# Patient Record
Sex: Female | Born: 1985 | Race: White | Hispanic: No | Marital: Married | Smoking: Never smoker
Health system: Southern US, Community
[De-identification: ages and names within clinical notes are randomized; demographics above are authoritative.]

## PROBLEM LIST (undated history)

## (undated) HISTORY — PX: WISDOM TOOTH EXTRACTION: SHX21

---

## 2017-07-26 ENCOUNTER — Ambulatory Visit: Payer: Self-pay | Admitting: Podiatry

## 2019-06-10 ENCOUNTER — Ambulatory Visit
Admission: EM | Admit: 2019-06-10 | Discharge: 2019-06-10 | Disposition: A | Discharge status: Home / Self Care | Payer: 59 | Attending: Family Medicine | Admitting: Family Medicine

## 2019-06-10 ENCOUNTER — Other Ambulatory Visit: Payer: Self-pay

## 2019-06-10 DIAGNOSIS — J309 Allergic rhinitis, unspecified: Secondary | ICD-10-CM | POA: Diagnosis not present

## 2019-06-10 DIAGNOSIS — M546 Pain in thoracic spine: Secondary | ICD-10-CM | POA: Diagnosis not present

## 2019-06-10 DIAGNOSIS — U071 COVID-19: Secondary | ICD-10-CM

## 2019-06-10 HISTORY — DX: COVID-19: U07.1

## 2019-06-10 MED ORDER — TIZANIDINE HCL 4 MG PO TABS: 4.0000 mg | ORAL_TABLET | Freq: Three times a day (TID) | ORAL | 0 refills | Status: DC | PRN

## 2019-06-10 MED ORDER — IPRATROPIUM BROMIDE 0.06 % NA SOLN: 2.0000 | Freq: Four times a day (QID) | NASAL | 0 refills | Status: DC | PRN

## 2019-06-10 MED ORDER — MELOXICAM 15 MG PO TABS: 15.0000 mg | ORAL_TABLET | Freq: Every day | ORAL | 0 refills | Status: DC | PRN

## 2019-06-10 NOTE — Discharge Instructions (Signed)
Medications as prescribed.  Heat/hot bath/hot shower. Be careful with use of muscle relaxer (zanaflex) as it may make you drowsy.  Take care  Dr. Adriana Simas

## 2019-06-10 NOTE — ED Provider Notes (Signed)
[facility]    CSN: [id number] Arrival date & time: 06/10/19  2831  History   Chief Complaint Chief Complaint  Patient presents with  . Back Pain  . Nasal Congestion   HPI  34 year old female presents with the above complaints.  Patient reports that she developed mid to upper back pain on Sunday.  No fall, trauma, injury.  Bilateral.  She states that it has continued to persist and worsen.  Radiates upward between the shoulder blades.  Rates the pain as 9/10 in severity.  She is taken some over-the-counter medication without improvement.  No known relieving factors.  Additionally, patient reports that she has had nasal congestion since yesterday.  Patient states that she does not feel well.  She states that she has an associated headache which has not responded to over-the-counter medication.  No documented fever.  No sick contacts.  No other complaints.  Past Surgical History:  Procedure Laterality Date  . CESAREAN SECTION    . WISDOM TOOTH EXTRACTION     OB History   No obstetric history on file.    Home Medications    Prior to Admission medications   Medication Sig Start Date End Date Taking? Authorizing Provider  levothyroxine (SYNTHROID) 112 MCG tablet TAKE 1 TAB(S) ONCE A DAY ORALLY 30 DAYS -- EVERY EVENING 02/15/16  Yes [provider]  ipratropium (ATROVENT) 0.06 % nasal spray Place 2 sprays into both nostrils 4 (four) times daily as needed for rhinitis. 06/10/19   Coral Spikes, DO  meloxicam (MOBIC) 15 MG tablet Take 1 tablet (15 mg total) by mouth daily as needed for pain. 06/10/19   Coral Spikes, DO  tiZANidine (ZANAFLEX) 4 MG tablet Take 1 tablet (4 mg total) by mouth every 8 (eight) hours as needed for muscle spasms. 06/10/19   Coral Spikes, DO    Family History Family History  Problem Relation Age of Onset  . Healthy Mother   . Healthy Father     Social History Social History   Tobacco Use  . Smoking status: Never Smoker  .  Smokeless tobacco: Never Used  Substance Use Topics  . Alcohol use: Never  . Drug use: Never     Allergies   Patient has no known allergies.   Review of Systems Review of Systems  Constitutional: Negative for fever.  HENT: Positive for congestion.   Musculoskeletal: Positive for back pain.   Physical Exam Triage Vital Signs ED Triage Vitals  Enc Vitals Group     BP 06/10/19 0910 119/76     Pulse Rate 06/10/19 0910 91     Resp 06/10/19 0910 18     Temp 06/10/19 0910 98.3 F (36.8 C)     Temp Source 06/10/19 0910 Oral     SpO2 06/10/19 0910 100 %     Weight 06/10/19 0907 240 lb (108.9 kg)     Height 06/10/19 0907 5\' 8"  (1.727 m)     Head Circumference --      Peak Flow --      Pain Score 06/10/19 0907 9     Pain Loc --      Pain Edu? --      Excl. in [place]? --    Updated Vital Signs BP 119/76 (BP Location: Right Arm)   Pulse 91   Temp 98.3 F (36.8 C) (Oral)   Resp 18   Ht 5\' 8"  (1.727 m)   Wt 108.9 kg   LMP 06/08/2019  SpO2 100%   BMI 36.49 kg/m   Visual Acuity Right Eye Distance:   Left Eye Distance:   Bilateral Distance:    Right Eye Near:   Left Eye Near:    Bilateral Near:     Physical Exam Vitals and nursing note reviewed.  Constitutional:      General: She is not in acute distress.    Appearance: Normal appearance. She is not ill-appearing.  HENT:     Head: Normocephalic and atraumatic.     Ears:     Comments: TMs with scarring.  No erythema, bulging, or effusion.    Nose: Congestion present. No rhinorrhea.  Eyes:     General:        Right eye: No discharge.        Left eye: No discharge.     Conjunctiva/sclera: Conjunctivae normal.  Cardiovascular:     Rate and Rhythm: Normal rate and regular rhythm.     Heart sounds: No murmur.  Pulmonary:     Effort: Pulmonary effort is normal.     Breath sounds: Normal breath sounds. No wheezing, rhonchi or rales.  Musculoskeletal:     Comments: Thoracic spine -no tenderness to palpation.   Neurological:     Mental Status: She is alert.  Psychiatric:        Mood and Affect: Mood normal.        Behavior: Behavior normal.    UC Treatments / Results  Labs (all labs ordered are listed, but only abnormal results are displayed) Labs Reviewed - No data to display  EKG   Radiology No results found.  Procedures Procedures (including critical care time)  Medications Ordered in UC Medications - No data to display  Initial Impression / Assessment and Plan / UC Course  I have reviewed the triage vital signs and the nursing notes.  Pertinent labs & imaging results that were available during my care of the patient were reviewed by me and considered in my medical decision making (see chart for details).    34 year old female presents with back pain and allergic rhinitis.  Treating with Atrovent nasal spray as well as Zanaflex and Mobic.  Supportive care.  Final Clinical Impressions(s) / UC Diagnoses   Final diagnoses:  Acute bilateral thoracic back pain  Allergic rhinitis, unspecified seasonality, unspecified trigger     Discharge Instructions     Medications as prescribed.  Heat/hot bath/hot shower. Be careful with use of muscle relaxer (zanaflex) as it may make you drowsy.  Take care  Dr. Adriana Simas    ED Prescriptions    Medication Sig Dispense Auth. Provider   ipratropium (ATROVENT) 0.06 % nasal spray Place 2 sprays into both nostrils 4 (four) times daily as needed for rhinitis. 15 mL Layaan Mott G, DO   tiZANidine (ZANAFLEX) 4 MG tablet Take 1 tablet (4 mg total) by mouth every 8 (eight) hours as needed for muscle spasms. 30 tablet Hazelle Woollard G, DO   meloxicam (MOBIC) 15 MG tablet Take 1 tablet (15 mg total) by mouth daily as needed for pain. 30 tablet Tommie Sams, DO     PDMP not reviewed this encounter.   Everlene Other Lehr, [place] 06/10/19 [phone]

## 2019-06-10 NOTE — ED Triage Notes (Signed)
Patient states that she has been having mid to upper back pain since Sunday. States that the back pain has continued to worsen.   States that she has nasal congestion that she noticed yesterday around lunchtime.

## 2019-06-16 ENCOUNTER — Ambulatory Visit (INDEPENDENT_AMBULATORY_CARE_PROVIDER_SITE_OTHER)
Admission: EM | Admit: 2019-06-16 | Discharge: 2019-06-16 | Disposition: A | Discharge status: Other Acute Inpatient Hospital | Payer: 59 | Source: Home / Self Care | Attending: Family Medicine | Admitting: Family Medicine

## 2019-06-16 ENCOUNTER — Other Ambulatory Visit: Payer: Self-pay

## 2019-06-16 ENCOUNTER — Emergency Department
Admission: EM | Admit: 2019-06-16 | Discharge: 2019-06-16 | Disposition: A | Discharge status: Home / Self Care | Payer: 59 | Attending: Family Medicine | Admitting: Family Medicine

## 2019-06-16 ENCOUNTER — Ambulatory Visit (INDEPENDENT_AMBULATORY_CARE_PROVIDER_SITE_OTHER): Payer: 59

## 2019-06-16 ENCOUNTER — Encounter: Payer: Self-pay | Admitting: Emergency Medicine

## 2019-06-16 DIAGNOSIS — S93119A Dislocation of interphalangeal joint of unspecified toe(s), initial encounter: Secondary | ICD-10-CM | POA: Diagnosis not present

## 2019-06-16 DIAGNOSIS — S99922A Unspecified injury of left foot, initial encounter: Secondary | ICD-10-CM | POA: Diagnosis present

## 2019-06-16 DIAGNOSIS — Y999 Unspecified external cause status: Secondary | ICD-10-CM | POA: Diagnosis not present

## 2019-06-16 DIAGNOSIS — W109XXA Fall (on) (from) unspecified stairs and steps, initial encounter: Secondary | ICD-10-CM | POA: Diagnosis not present

## 2019-06-16 DIAGNOSIS — Y9301 Activity, walking, marching and hiking: Secondary | ICD-10-CM | POA: Diagnosis not present

## 2019-06-16 DIAGNOSIS — Y929 Unspecified place or not applicable: Secondary | ICD-10-CM | POA: Insufficient documentation

## 2019-06-16 DIAGNOSIS — W108XXA Fall (on) (from) other stairs and steps, initial encounter: Secondary | ICD-10-CM | POA: Insufficient documentation

## 2019-06-16 DIAGNOSIS — S93115A Dislocation of interphalangeal joint of left lesser toe(s), initial encounter: Secondary | ICD-10-CM | POA: Insufficient documentation

## 2019-06-16 DIAGNOSIS — S92912A Unspecified fracture of left toe(s), initial encounter for closed fracture: Secondary | ICD-10-CM

## 2019-06-16 DIAGNOSIS — S93105A Unspecified dislocation of left toe(s), initial encounter: Secondary | ICD-10-CM

## 2019-06-16 DIAGNOSIS — S92535A Nondisplaced fracture of distal phalanx of left lesser toe(s), initial encounter for closed fracture: Secondary | ICD-10-CM | POA: Diagnosis not present

## 2019-06-16 DIAGNOSIS — Z8616 Personal history of COVID-19: Secondary | ICD-10-CM | POA: Insufficient documentation

## 2019-06-16 MED ORDER — MELOXICAM 15 MG PO TABS: 15.0000 mg | ORAL_TABLET | Freq: Every day | ORAL | 0 refills | Status: DC

## 2019-06-16 MED ORDER — TRAMADOL HCL 50 MG PO TABS: 50.0000 mg | ORAL_TABLET | Freq: Four times a day (QID) | ORAL | 0 refills | Status: DC | PRN

## 2019-06-16 MED ORDER — MELOXICAM 7.5 MG PO TABS
15.0000 mg | ORAL_TABLET | Freq: Once | ORAL | Status: AC
  Administered 2019-06-16: 15 mg via ORAL
  Filled 2019-06-16: qty 2

## 2019-06-16 NOTE — ED Provider Notes (Signed)
[facility]    CSN: [id number] Arrival date & time: 06/16/19  1659  History   Chief Complaint Chief Complaint  Patient presents with  . Toe Injury   HPI  34 year old female presents with a toe injury.  Patient reports that she fell within the stairs today.  In doing so she injured her left second toe.  The toe is visibly deformed.  Pain is 10/10 in severity.  No relieving factors.  No other associated symptoms.  No other complaints.  Past Medical History:  Diagnosis Date  . COVID-19 06/10/2019   Past Surgical History:  Procedure Laterality Date  . CESAREAN SECTION    . WISDOM TOOTH EXTRACTION     OB History   No obstetric history on file.    Home Medications    Prior to Admission medications   Medication Sig Start Date End Date Taking? Authorizing Provider  levothyroxine (SYNTHROID) 112 MCG tablet TAKE 1 TAB(S) ONCE A DAY ORALLY 30 DAYS -- EVERY EVENING 02/15/16  Yes [provider]  meloxicam (MOBIC) 15 MG tablet Take 1 tablet (15 mg total) by mouth daily. 06/16/19   Cuthriell, Charline Bills, PA-C  traMADol (ULTRAM) 50 MG tablet Take 1 tablet (50 mg total) by mouth every 6 (six) hours as needed. 06/16/19   Cuthriell, Charline Bills, PA-C  ipratropium (ATROVENT) 0.06 % nasal spray Place 2 sprays into both nostrils 4 (four) times daily as needed for rhinitis. 06/10/19 06/16/19  Coral Spikes, DO    Family History Family History  Problem Relation Age of Onset  . Healthy Mother   . Healthy Father     Social History Social History   Tobacco Use  . Smoking status: Never Smoker  . Smokeless tobacco: Never Used  Substance Use Topics  . Alcohol use: Never  . Drug use: Never     Allergies   Patient has no known allergies.   Review of Systems Review of Systems  Musculoskeletal:       Left second toe pain, swelling.   Physical Exam Triage Vital Signs ED Triage Vitals  Enc Vitals Group     BP 06/16/19 1724 119/82     Pulse Rate 06/16/19 1724 (!)  110     Resp 06/16/19 1724 18     Temp 06/16/19 1724 98.8 F (37.1 C)     Temp Source 06/16/19 1724 Oral     SpO2 06/16/19 1724 98 %     Weight 06/16/19 1722 240 lb (108.9 kg)     Height 06/16/19 1722 5\' 9"  (1.753 m)     Head Circumference --      Peak Flow --      Pain Score 06/16/19 1721 10     Pain Loc --      Pain Edu? --      Excl. in [place]? --    No data found.  Updated Vital Signs BP 119/82 (BP Location: Right Arm)   Pulse (!) 110   Temp 98.8 F (37.1 C) (Oral)   Resp 18   Ht 5\' 9"  (1.753 m)   Wt 108.9 kg   LMP 06/08/2019   SpO2 98%   BMI 35.44 kg/m   Visual Acuity Right Eye Distance:   Left Eye Distance:   Bilateral Distance:    Right Eye Near:   Left Eye Near:    Bilateral Near:     Physical Exam Vitals and nursing note reviewed.  Constitutional:      General:  She is not in acute distress.    Appearance: Normal appearance. She is obese. She is not ill-appearing.  HENT:     Head: Normocephalic and atraumatic.  Eyes:     General:        Right eye: No discharge.        Left eye: No discharge.     Conjunctiva/sclera: Conjunctivae normal.  Pulmonary:     Effort: Pulmonary effort is normal. No respiratory distress.  Musculoskeletal:     Comments: Left second toe - swelling, tenderness to palpation. Visible deformity.  Neurological:     Mental Status: She is alert.  Psychiatric:        Mood and Affect: Mood normal.        Behavior: Behavior normal.    UC Treatments / Results  Labs (all labs ordered are listed, but only abnormal results are displayed) Labs Reviewed - No data to display  EKG   Radiology DG Toe 2nd Left  Result Date: 06/16/2019 CLINICAL DATA:  Larey Seat down stairs with second toe injury. EXAM: LEFT SECOND TOE COMPARISON:  None. FINDINGS: Three views study shows PIP joint dislocation at the second toe with dorsal distraction of the middle phalanx relative to the head of the proximal phalanx. Cortical bone fragments are seen along the  plantar aspect of the head of the proximal phalanx with corresponding donor site seen plantar aspect of the base of the middle phalanx. IMPRESSION: Fracture dislocation involving the PIP joint of the second toe. Electronically Signed   By: Kennith Center M.D.   On: 06/16/2019 18:32    Procedures Procedures (including critical care time)  Medications Ordered in UC Medications - No data to display  Initial Impression / Assessment and Plan / UC Course  I have reviewed the triage vital signs and the nursing notes.  Pertinent labs & imaging results that were available during my care of the patient were reviewed by me and considered in my medical decision making (see chart for details).    34 year old female presents with a fracture dislocation of the PIP joint of the left second toe.  Patient going to the ER for reduction.  Final Clinical Impressions(s) / UC Diagnoses   Final diagnoses:  Dislocation of interphalangeal joint of toe, initial encounter  Closed nondisplaced fracture of phalanx of toe of left foot, unspecified toe, initial encounter     Discharge Instructions     Go to the hospital.  They will be able to see the Xray.  Take care  Dr. Adriana Simas    ED Prescriptions    None     PDMP not reviewed this encounter.   Tommie Sams, [place] 06/16/19 2312

## 2019-06-16 NOTE — Discharge Instructions (Signed)
Go to the hospital.  They will be able to see the Xray.  Take care  Dr. Adriana Simas

## 2019-06-16 NOTE — ED Triage Notes (Signed)
Patient ambulatory to triage with steady gait, without difficulty or distress noted, mask in place; pt reports fell down stairs today and was sent over by [facility] for dislocated left 2nd toe; pt denies any other c/o or injuries

## 2019-06-16 NOTE — ED Triage Notes (Signed)
Patient states she fell down the stairs today injuring her left 2nd toe.

## 2019-06-16 NOTE — ED Provider Notes (Signed)
[facility] Emergency Department Provider Note  ____________________________________________  Time seen: Approximately 10:11 PM  I have reviewed the triage vital signs and the nursing notes.   HISTORY  Chief Complaint Toe Injury    HPI Sherry Powers is a 34 y.o. female who presents the emergency department complaining of toe dislocation fracture.  Patient states that she accidentally fell down a flight of stairs earlier today.  She was seen in urgent care, it was found that she had a dislocated toe fracture.  Patient had avulsion fracture off of the the head of the proximal phalanx with dislocation of the PIP joint.  Patient reports that urgent care did not feel comfortable reducing this and sent her to the emergency department for evaluation.  Patient denies any other injury or complaint at this time.  She is here for reduction of her toe.        Past Medical History:  Diagnosis Date  . COVID-19 06/10/2019    There are no problems to display for this patient.   Past Surgical History:  Procedure Laterality Date  . CESAREAN SECTION    . WISDOM TOOTH EXTRACTION      Prior to Admission medications   Medication Sig Start Date End Date Taking? Authorizing Provider  levothyroxine (SYNTHROID) 112 MCG tablet TAKE 1 TAB(S) ONCE A DAY ORALLY 30 DAYS -- EVERY EVENING 02/15/16   [provider]  meloxicam (MOBIC) 15 MG tablet Take 1 tablet (15 mg total) by mouth daily. 06/16/19   Chanette Demo, Delorise Royals, PA-C  traMADol (ULTRAM) 50 MG tablet Take 1 tablet (50 mg total) by mouth every 6 (six) hours as needed. 06/16/19   Elene Downum, Delorise Royals, PA-C  ipratropium (ATROVENT) 0.06 % nasal spray Place 2 sprays into both nostrils 4 (four) times daily as needed for rhinitis. 06/10/19 06/16/19  Tommie Sams, DO    Allergies Patient has no known allergies.  Family History  Problem Relation Age of Onset  . Healthy Mother   . Healthy Father     Social  History Social History   Tobacco Use  . Smoking status: Never Smoker  . Smokeless tobacco: Never Used  Substance Use Topics  . Alcohol use: Never  . Drug use: Never     Review of Systems  Constitutional: No fever/chills Eyes: No visual changes. No discharge ENT: No upper respiratory complaints. Cardiovascular: no chest pain. Respiratory: no cough. No SOB. Gastrointestinal: No abdominal pain.  No nausea, no vomiting.  No diarrhea.  No constipation. Musculoskeletal: Toe dislocation fracture Skin: Negative for rash, abrasions, lacerations, ecchymosis. Neurological: Negative for headaches, focal weakness or numbness. 10-point ROS otherwise negative.  ____________________________________________   PHYSICAL EXAM:  VITAL SIGNS: ED Triage Vitals  Enc Vitals Group     BP 06/16/19 1935 136/81     Pulse Rate 06/16/19 1935 100     Resp 06/16/19 1935 20     Temp 06/16/19 1935 98.2 F (36.8 C)     Temp Source 06/16/19 1935 Oral     SpO2 06/16/19 1935 98 %     Weight 06/16/19 1936 240 lb (108.9 kg)     Height 06/16/19 1936 5\' 9"  (1.753 m)     Head Circumference --      Peak Flow --      Pain Score 06/16/19 1935 10     Pain Loc --      Pain Edu? --      Excl. in [place]? --  Constitutional: Alert and oriented. Well appearing and in no acute distress. Eyes: Conjunctivae are normal. PERRL. EOMI. Head: Atraumatic. ENT:      Ears:       Nose: No congestion/rhinnorhea.      Mouth/Throat: Mucous membranes are moist.  Neck: No stridor.    Cardiovascular: Normal rate, regular rhythm. Normal S1 and S2.  Good peripheral circulation. Respiratory: Normal respiratory effort without tachypnea or retractions. Lungs CTAB. Good air entry to the bases with no decreased or absent breath sounds. Musculoskeletal: Full range of motion to all extremities. No gross deformities appreciated.  Visualization of the second toe reveals deformity.  This is consistent with known fracture dislocation of  the digit.  Sensation, capillary refill intact.  After reduction, good alignment, good sensation, capillary refill and movement. Neurologic:  Normal speech and language. No gross focal neurologic deficits are appreciated.  Skin:  Skin is warm, dry and intact. No rash noted. Psychiatric: Mood and affect are normal. Speech and behavior are normal. Patient exhibits appropriate insight and judgement.   ____________________________________________   LABS (all labs ordered are listed, but only abnormal results are displayed)  Labs Reviewed - No data to display ____________________________________________  EKG   ____________________________________________  RADIOLOGY I personally viewed and evaluated these images as part of my medical decision making, as well as reviewing the written report by the radiologist.  DG Toe 2nd Left  Result Date: 06/16/2019 CLINICAL DATA:  Larey Seat down stairs with second toe injury. EXAM: LEFT SECOND TOE COMPARISON:  None. FINDINGS: Three views study shows PIP joint dislocation at the second toe with dorsal distraction of the middle phalanx relative to the head of the proximal phalanx. Cortical bone fragments are seen along the plantar aspect of the head of the proximal phalanx with corresponding donor site seen plantar aspect of the base of the middle phalanx. IMPRESSION: Fracture dislocation involving the PIP joint of the second toe. Electronically Signed   By: Kennith Center M.D.   On: 06/16/2019 18:32    ____________________________________________    PROCEDURES  Procedure(s) performed:    Reduction of dislocation  Date/Time: 06/16/2019 10:39 PM Performed by: Racheal Patches, PA-C Authorized by: Racheal Patches, PA-C  Consent: Verbal consent obtained. Risks and benefits: risks, benefits and alternatives were discussed Consent given by: patient Patient understanding: patient states understanding of the procedure being performed Imaging  studies: imaging studies available Required items: required blood products, implants, devices, and special equipment available Patient identity confirmed: verbally with patient Time out: Immediately prior to procedure a "time out" was called to verify the correct patient, procedure, equipment, support staff and site/side marked as required. Local anesthesia used: no  Anesthesia: Local anesthesia used: no  Sedation: Patient sedated: no  Patient tolerance: patient tolerated the procedure well with no immediate complications Comments: Stabilization of the proximal phalanx, direct manipulation over the PIP joint.  Good reduction visibly and palpably.  No indication for repeat imaging.  Toe was buddy taped at this time and postop shoe placed.       Medications  meloxicam (MOBIC) tablet 15 mg (has no administration in time range)     ____________________________________________   INITIAL IMPRESSION / ASSESSMENT AND PLAN / ED COURSE  Pertinent labs & imaging results that were available during my care of the patient were reviewed by me and considered in my medical decision making (see chart for details).  Review of the [place] CSRS was performed in accordance of the [facility] prior to dispensing any controlled drugs.  Patient's diagnosis is consistent with dislocation of toe of the left foot.  Patient presented to emergency department from urgent care with a known toe dislocation/fracture.  Patient had a small avulsion off the head of the proximal phalanx.  Patient had a PIP joint dislocation.  Patient reports that urgent care did not feel comfortable reducing this and sent her to the emergency department.  Reduction was successfully completed here in the emergency department.  Toe is buddy taped and postop shoe was given to the patient.  Patient will be prescribed anti-inflammatory and Ultram for symptom relief.  Follow-up podiatry as needed.. Patient is given ED precautions to return  to the ED for any worsening or new symptoms.     ____________________________________________  FINAL CLINICAL IMPRESSION(S) / ED DIAGNOSES  Final diagnoses:  Dislocation of phalanx of left foot, initial encounter      NEW MEDICATIONS STARTED DURING THIS VISIT:  ED Discharge Orders         Ordered    meloxicam (MOBIC) 15 MG tablet  Daily     06/16/19 2237    traMADol (ULTRAM) 50 MG tablet  Every 6 hours PRN     06/16/19 2237              This chart was dictated using voice recognition software/Dragon. Despite best efforts to proofread, errors can occur which can change the meaning. Any change was purely unintentional.    Darletta Moll, PA-C 06/16/19 2241    Nance Pear, MD 06/16/19 2242

## 2021-07-21 IMAGING — CR DG TOE 2ND 2+V*L*
3 series · 3 of 3 positions shown · non-contrast
Comparison: None.

CLINICAL DATA: Fell down stairs with second toe injury.

EXAM:
LEFT SECOND TOE

[toe ap]
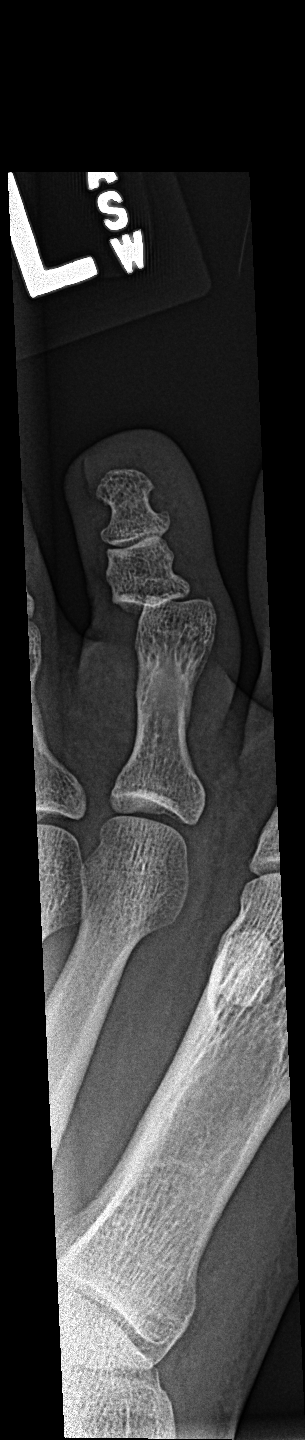

[toe obl]
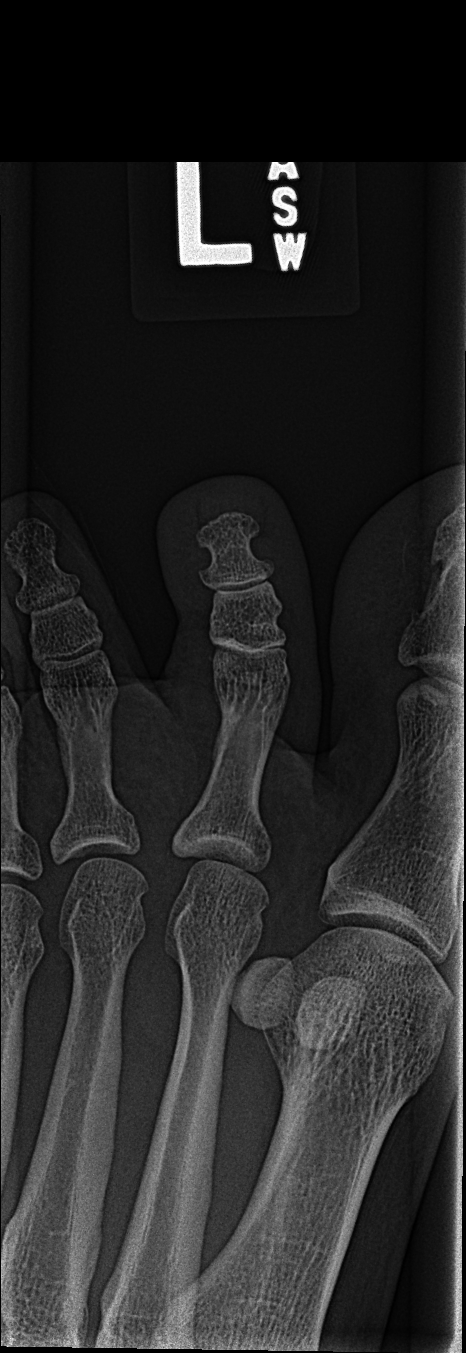

[toe lat]
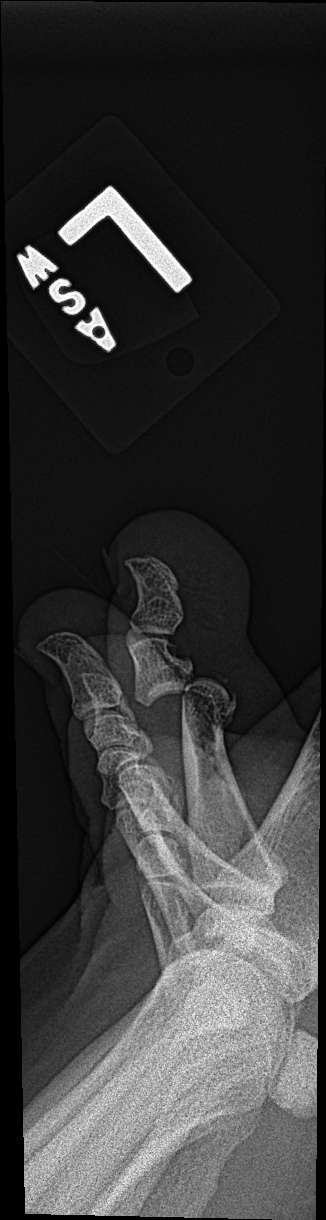

[3 of 3 positions shown; findings below may reference images not displayed]

FINDINGS: Three views study shows PIP joint dislocation at the second toe with
dorsal distraction of the middle phalanx relative to the head of the
proximal phalanx. Cortical bone fragments are seen along the plantar
aspect of the head of the proximal phalanx with corresponding donor
site seen plantar aspect of the base of the middle phalanx.
IMPRESSION: Fracture dislocation involving the PIP joint of the second toe.

## 2022-12-19 ENCOUNTER — Ambulatory Visit
Admission: RE | Admit: 2022-12-19 | Discharge: 2022-12-19 | Disposition: A | Discharge status: Home / Self Care | Payer: Medicaid Other | Source: Ambulatory Visit | Attending: Emergency Medicine | Admitting: Emergency Medicine

## 2022-12-19 VITALS — BP 130/80 | HR 79 | Temp 98.5°F | Resp 17

## 2022-12-19 DIAGNOSIS — R202 Paresthesia of skin: Secondary | ICD-10-CM

## 2022-12-19 DIAGNOSIS — M791 Myalgia, unspecified site: Secondary | ICD-10-CM | POA: Diagnosis not present

## 2022-12-19 MED ORDER — BACLOFEN 10 MG PO TABS: 10.0000 mg | ORAL_TABLET | Freq: Three times a day (TID) | ORAL | 0 refills | Status: AC

## 2022-12-19 MED ORDER — IBUPROFEN 600 MG PO TABS: 600.0000 mg | ORAL_TABLET | Freq: Four times a day (QID) | ORAL | 0 refills | Status: AC | PRN

## 2022-12-19 NOTE — ED Provider Notes (Signed)
[facility]    CSN: [id number] Arrival date & time: 12/19/22  1248      History   Chief Complaint Chief Complaint  Patient presents with   Shoulder Pain    My hand/arm/shoulder have been falling asleep for 2 weeks now constantly day/night. - Entered by patient   Extremity Weakness    HPI Sherry Powers is a 37 y.o. female.   HPI  37 year old female with no significant past medical history presents for evaluation of numbness and tingling in her right arm that has been going on for the past 2 weeks.  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  This is a more physical job that she is ever used in the past.  She denies any pain in her neck or weakness.  She states that the tingling will occasionally increase to a throbbing pain and with stretching she is able to resolve her symptoms.  Past Medical History:  Diagnosis Date   COVID-19 06/10/2019    There are no problems to display for this patient.   Past Surgical History:  Procedure Laterality Date   CESAREAN SECTION     WISDOM TOOTH EXTRACTION      OB History   No obstetric history on file.      Home Medications    Prior to Admission medications   Medication Sig Start Date End Date Taking? Authorizing Provider  baclofen (LIORESAL) 10 MG tablet Take 1 tablet (10 mg total) by mouth 3 (three) times daily. 12/19/22  Yes Becky Augusta, NP  ibuprofen (ADVIL) 600 MG tablet Take 1 tablet (600 mg total) by mouth every 6 (six) hours as needed. 12/19/22  Yes Becky Augusta, NP  levothyroxine (SYNTHROID) 112 MCG tablet TAKE 1 TAB(S) ONCE A DAY ORALLY 30 DAYS -- EVERY EVENING 02/15/16  Yes [provider]  ipratropium (ATROVENT) 0.06 % nasal spray Place 2 sprays into both nostrils 4 (four) times daily as needed for rhinitis. 06/10/19 06/16/19  Tommie Sams, DO    Family History Family History  Problem Relation Age  of Onset   Healthy Mother    Healthy Father     Social History Social History   Tobacco Use   Smoking status: Never   Smokeless tobacco: Never  Vaping Use   Vaping status: Never Used  Substance Use Topics   Alcohol use: Never   Drug use: Never     Allergies   Latex   Review of Systems Review of Systems  Musculoskeletal:  Positive for myalgias. Negative for neck pain and neck stiffness.  Neurological:  Positive for numbness. Negative for weakness.     Physical Exam Triage Vital Signs ED Triage Vitals  Encounter Vitals Group     BP 12/19/22 1258 130/80     Systolic BP Percentile --      Diastolic BP Percentile --      Pulse Rate 12/19/22 1258 79     Resp 12/19/22 1258 17     Temp 12/19/22 1258 98.5 F (36.9 C)     Temp Source 12/19/22 1258 Oral     SpO2 12/19/22 1258 98 %     Weight --      Height --      Head Circumference --      Peak Flow --      Pain Score 12/19/22 1257 0     Pain Loc --  Pain Education --      Exclude from Growth Chart --    No data found.  Updated Vital Signs BP 130/80 (BP Location: Right Arm)   Pulse 79   Temp 98.5 F (36.9 C) (Oral)   Resp 17   LMP 11/27/2022 (Approximate)   SpO2 98%   Visual Acuity Right Eye Distance:   Left Eye Distance:   Bilateral Distance:    Right Eye Near:   Left Eye Near:    Bilateral Near:     Physical Exam Vitals and nursing note reviewed.  Constitutional:      Appearance: Normal appearance. She is not ill-appearing.  HENT:     Head: Normocephalic and atraumatic.  Musculoskeletal:        General: Tenderness present. No swelling or signs of injury. Normal range of motion.     Comments: Patient has normal range of motion in her neck and no spinous process tenderness or step-off in her cervical spine.  Patient does have some tension in the body of her right trapezius as well as in her right bicep, tricep, and the muscles of her forearm.  Skin:    General: Skin is warm.     Capillary  Refill: Capillary refill takes less than 2 seconds.  Neurological:     General: No focal deficit present.     Mental Status: She is alert and oriented to person, place, and time.     Motor: No weakness.  Psychiatric:        Mood and Affect: Mood normal.        Behavior: Behavior normal.        Thought Content: Thought content normal.        Judgment: Judgment normal.      UC Treatments / Results  Labs (all labs ordered are listed, but only abnormal results are displayed) Labs Reviewed - No data to display  EKG   Radiology No results found.  Procedures Procedures (including critical care time)  Medications Ordered in UC Medications - No data to display  Initial Impression / Assessment and Plan / UC Course  I have reviewed the triage vital signs and the nursing notes.  Pertinent labs & imaging results that were available during my care of the patient were reviewed by me and considered in my medical decision making (see chart for details).   Patient is a very pleasant, nontoxic-appearing 37 year old female presenting for evaluation of paresthesias in her right arm that been going on for the last 2 weeks as outlined HPI above.  On exam she has 5/5 grip strength bilaterally as well as 5/5 upper extremity strength.  She does have some tenderness with palpation of the muscles of her forearm, upper arm, and in the trapezius muscle on the right side.  No midline spinous process tenderness or step-off.  She has normal range of motion of her cervical spine without any exacerbation of symptoms.  I suspect that her symptoms are musculoskeletal in nature and the muscle tension in her right arm is causing some mild nerve impingement inducing the paresthesias.  I will treat her with home physical therapy, NSAIDs, and baclofen.  I have advised her that if her symptoms do not improve, or they worsen, that she should follow-up with orthopedics as she may need dedicated physical therapy or further  interventions that we cannot provide here.   Final Clinical Impressions(s) / UC Diagnoses   Final diagnoses:  Paresthesia of right arm  Muscle tension pain  Discharge Instructions      As we discussed, I suspect an muscle tension in your right arm and shoulder are causing some pressure to be applied to your nerves which is causing the numbness.  Perform the home physical therapy exercises provided in your discharge instructions.  I would do these once or twice daily and see if your symptoms improve.  You may use the ibuprofen 600 mg every 6 hours with food to help with inflammation and pain.  Use the baclofen 10 mg every 8 hours as needed for muscle tension and pain.  If your symptoms do not improve I recommend that you follow-up with orthopedics.  [facility] has a walk-in urgent care in [facility] Monday through Saturday and in [place] 7 days a week.     ED Prescriptions     Medication Sig Dispense Auth. Provider   baclofen (LIORESAL) 10 MG tablet Take 1 tablet (10 mg total) by mouth 3 (three) times daily. 30 each Becky Augusta, NP   ibuprofen (ADVIL) 600 MG tablet Take 1 tablet (600 mg total) by mouth every 6 (six) hours as needed. 30 tablet Becky Augusta, NP      PDMP not reviewed this encounter.   Becky Augusta, NP 12/19/22 1319

## 2022-12-19 NOTE — ED Triage Notes (Addendum)
My hand/arm/shoulder have been falling asleep for 2 weeks now constantly day/night.   Patient states that she started a new job that requires her to use her hands more. Complaint in right hand. Patient took naproxen this morning.

## 2022-12-19 NOTE — Discharge Instructions (Addendum)
As we discussed, I suspect an muscle tension in your right arm and shoulder are causing some pressure to be applied to your nerves which is causing the numbness.  Perform the home physical therapy exercises provided in your discharge instructions.  I would do these once or twice daily and see if your symptoms improve.  You may use the ibuprofen 600 mg every 6 hours with food to help with inflammation and pain.  Use the baclofen 10 mg every 8 hours as needed for muscle tension and pain.  If your symptoms do not improve I recommend that you follow-up with orthopedics.  [facility] has a walk-in urgent care in [facility] Monday through Saturday and in [place] 7 days a week.

## 2023-11-05 ENCOUNTER — Ambulatory Visit

## 2023-11-05 DIAGNOSIS — L719 Rosacea, unspecified: Secondary | ICD-10-CM | POA: Diagnosis not present

## 2023-11-05 DIAGNOSIS — Z7189 Other specified counseling: Secondary | ICD-10-CM | POA: Diagnosis not present

## 2023-11-05 MED ORDER — DOXYCYCLINE 40 MG PO CPDR: 40.0000 mg | DELAYED_RELEASE_CAPSULE | ORAL | 5 refills | Status: AC

## 2023-11-05 MED ORDER — METRONIDAZOLE 0.75 % EX CREA: TOPICAL_CREAM | CUTANEOUS | 5 refills | Status: AC

## 2023-11-05 NOTE — Progress Notes (Signed)
   New Patient Visit   Subjective  Sherry Powers is a 38 y.o. female who presents for the following: Patient with hx of rosacea, treated a couple years ago and stayed dormant for a while, summers make it flare. Patient was prescribed an oral abx, face cream, and face wash in the past that did work but does not remember name. Patient reports occasionally she does get bumps, but mostly red.   The following portions of the chart were reviewed this encounter and updated as appropriate: medications, allergies, medical history  Review of Systems:  No other skin or systemic complaints except as noted in HPI or Assessment and Plan.  Objective  Well appearing patient in no apparent distress; mood and affect are within normal limits.  A focused examination was performed of the following areas: Face  - Mild, diffuse erythema and telangiectasias involving the central face - occasional papule, pustule     Assessment & Plan   Rosacea Chronic and persistent condition with duration or expected duration over one year. Condition is symptomatic and bothersome to patient. Patient is flaring and not currently at treatment goal.   - Reviewed etiology and probable recurrent nature of this condition - Discussed potential triggers including caffeine, heat, sun exposure, chocolate, etc. and recommended patient attempt to identify and avoid triggers - Start Doxycycline  40 mg take po once daily.  - Discussed side effects and precautions with doxycycline  including taking with meal, waiting at least 30 minutes before lying down at night, increased sun sensitivity, and to stop medication if becomes pregnant or breastfeeding  - Start compounded Azelaic Acid 15%, Metronidazole  1%, Ivermectin 1% Cream from [facility] - Sun avoidance, protective clothing sunscreen discussed - Telangiectasias will likely not fade completely, laser removal may be considered as a cosmetic procedure   Counseling for BBL / IPL /  Laser and Coordination of Care Discussed the treatment option of Broad Band Light (BBL) /Intense Pulsed Light (IPL)/ Laser for skin discoloration, including brown spots and redness.  Typically we recommend at least 1-3 treatment sessions about 5-8 weeks apart for best results.  Cannot have tanned skin when BBL performed, and regular use of sunscreen/photoprotection is advised after the procedure to help maintain results. The patient's condition may also require maintenance treatments in the future.  The fee for BBL / laser treatments is $350 per treatment session for the whole face.  A fee can be quoted for other parts of the body.  Insurance typically does not pay for BBL/laser treatments and therefore the fee is an out-of-pocket cost. Recommend prophylactic valtrex treatment. Once scheduled for procedure, will send Rx in prior to patient's appointment.     Return for 3-4 months rosacea f/u , w/ Dr. Raymund.  I, Jacquelynn V. Wilfred, CMA, am acting as scribe for Lauraine JAYSON Raymund, MD .   Documentation: I have reviewed the above documentation for accuracy and completeness, and I agree with the above.  Lauraine JAYSON Raymund, MD  .

## 2023-11-05 NOTE — Patient Instructions (Addendum)
 Counseling for BBL / IPL / Laser and Coordination of Care Discussed the treatment option of Broad Band Light (BBL) /Intense Pulsed Light (IPL)/ Laser for skin discoloration, including brown spots and redness.  Typically we recommend at least 1-3 treatment sessions about 5-8 weeks apart for best results.  Cannot have tanned skin when BBL performed, and regular use of sunscreen/photoprotection is advised after the procedure to help maintain results. The patient's condition may also require maintenance treatments in the future.  The fee for BBL / laser treatments is $350 per treatment session for the whole face.  A fee can be quoted for other parts of the body.  Insurance typically does not pay for BBL/laser treatments and therefore the fee is an out-of-pocket cost. Recommend prophylactic valtrex treatment. Once scheduled for procedure, will send Rx in prior to patient's appointment.     Recommend Elta MD Sunscreen    Sunscreen  Who needs sunscreen? Everyone. Sunscreen use can help prevent skin cancer by protecting you from the sun's harmful ultraviolet rays. Anyone can get skin cancer, regardless of age, gender or race. In fact, it is estimated that one in five Americans will develop skin cancer in their lifetime.  Sunscreen alone cannot fully protect you. In addition to wearing sunscreen, dermatologists recommend taking the following steps to protect your skin and find skin cancer early:  Seek shade when appropriate, remembering that the sun's rays are strongest between 10 a.m. and 2 p.m. If your shadow is shorter than you are, seek shade. Dress to protect yourself from the sun by wearing a lightweight long-sleeved shirt, pants, a wide-brimmed hat and sunglasses, when possible.  Use extra caution near water, snow and sand as they reflect the damaging rays of the sun, which can increase your chance of sunburn.  Get vitamin D safely through a healthy diet that may include vitamin supplements. Don't  seek the sun. Avoid tanning beds. Ultraviolet light from the sun and tanning beds can cause skin cancer and wrinkling. If you want to look tan, you may wish to use a self-tanning product, but continue to use sunscreen with it.  When should I use sunscreen? Every day you go outside--even if you're just walking to and from your form of transportation. The sun emits harmful UV rays year-round. Even on cloudy days, up to 80 percent of the sun's harmful UV rays can penetrate your skin. Snow, sand and water increase the need for sunscreen because they reflect the sun's rays.  How much sunscreen should I use, and how often should I apply it? Most people only apply 25-50 percent of the recommended amount of sunscreen. Apply enough sunscreen to cover all exposed skin. Most adults need about 1 ounce -- or enough to fill a shot glass -- to fully cover their body.  Don't forget to apply to the tops of your feet, your neck, your ears and the top of your head. Apply sunscreen to dry skin 15 minutes before going outdoors.  Skin cancer also can form on the lips. To protect your lips, apply a lip balm or lipstick that contains sunscreen with an SPF of 30 or higher.  When outdoors, reapply sunscreen approximately every two hours, or after swimming or sweating, according to the directions on the bottle.   Broad-spectrum sunscreens protect against both UVA and UVB rays. What is the difference between the rays? Sunlight consists of two types of harmful rays that reach the earth -- UVA rays and UVB rays. Overexposure to either can  lead to skin cancer. In addition to causing skin cancer, here's what each of these rays do:  UVA rays (or aging rays) can prematurely age your skin, causing wrinkles and age spots, and can pass through window glass. UVB rays (or burning rays) are the primary cause of sunburn and are blocked by window glass  There is no safe way to tan. Every time you tan, you damage your skin. As this damage  builds, you speed up the aging of your skin and increase your risk for all types of skin cancer.  What is the difference between chemical and physical sunscreens? Chemical sunscreens work like a sponge, absorbing the sun's rays. They contain one or more of the following active ingredients: oxybenzone, avobenzone, octisalate, octocrylene, homosalate and octinoxate. These formulations tend to be easier to rub into the skin without leaving a white residue.   Physical sunscreens work like a shield, sitting sit on the surface of your skin and deflecting the sun's rays. They contain the active ingredients zinc oxide and/or titanium dioxide. Use this sunscreen if you have sensitive skin.   What type of sunscreen should I use? The best type of sunscreen is the one you will use again and again. Just make sure it offers broad-spectrum (UVA and UVB) protection, has an SPF of 30+, and is water-resistant. The kind of sunscreen you use is a matter of personal choice, and may vary depending on the area of the body to be protected. Available sunscreen options include lotions, creams, gels, ointments, wax sticks and sprays.  Recommended physical sunscreens for face: - Neutrogena Sheer Zinc - Aveeno Positively Mineral Sensitive - CeraVe Hydrating Mineral (also has a tinted version) - La Roche-Posay Anthelios Mineral Face (comes as a cream, lotion, light fluid, and there is also a tinted version).  - EltaMD UV Clear (also has a tinted version)  Recommended physical sunscreens for body: - Neutrogena Sheer Zinc Dry-Touch Sunscreen Sensitive Skin Lotion Broad Spectrum SPF 50 - Aveeno Positively Mineral Sensitive Skin Sunscreen Broad Spectrum SPF 50 - La Roche-Posay Anthelios SPF 50 Mineral Sunscreen - Gentle Lotion - CeraVe Hydrating Mineral Sunscreen SPF 50  Recommended chemical sunscreens for face: - Anthelios UV Correct Face Sunscreen SPF 70 with Niacinamide - Neutrogena Clear Face Oil-Free SPF 50 with  Helioplex - Neutrogena Sport Face Oil-Free SPF 70+ with Helioplex - Aveeno Protect + Hydrate Sunscreen For Face SPF 70 - La Roche-Posay Anthelios Light Fluid Sunscreen for Face SPF 60  Recommended chemical sunscreens for body: - Neutrogena Ultra Sheer Dry-Touch Sunscreen SPF 70 - Aveeno Protect + Hydrate Broad Spectrum All-Day Hydration SPF 60 (comes in a big pump) - La Roche-Posay Anthelios Melt-In Milk Sunscreen SPF 60    Due to recent changes in healthcare laws, you may see results of your pathology and/or laboratory studies on MyChart before the doctors have had a chance to review them. We understand that in some cases there may be results that are confusing or concerning to you. Please understand that not all results are received at the same time and often the doctors may need to interpret multiple results in order to provide you with the best plan of care or course of treatment. Therefore, we ask that you please give us  2 business days to thoroughly review all your results before contacting the office for clarification. Should we see a critical lab result, you will be contacted sooner.   If You Need Anything After Your Visit  If you have any questions or concerns  for your doctor, please call our main line at [phone] and press option 4 to reach your doctor's medical assistant. If no one answers, please leave a voicemail as directed and we will return your call as soon as possible. Messages left after 4 pm will be answered the following business day.   You may also send us  a message via MyChart. We typically respond to MyChart messages within 1-2 business days.  For prescription refills, please ask your pharmacy to contact our office. Our fax number is [phone].  If you have an urgent issue when the clinic is closed that cannot wait until the next business day, you can page your doctor at the number below.    Please note that while we do our best to be available for urgent  issues outside of office hours, we are not available 24/7.   If you have an urgent issue and are unable to reach us , you may choose to seek medical care at your doctor's office, retail clinic, urgent care center, or emergency room.  If you have a medical emergency, please immediately call 911 or go to the emergency department.  Pager Numbers  - Dr. Hester: [phone]  - Dr. Jackquline: [phone]  - Dr. Claudene: [phone]   In the event of inclement weather, please call our main line at [phone] for an update on the status of any delays or closures.  Dermatology Medication Tips: Please keep the boxes that topical medications come in in order to help keep track of the instructions about where and how to use these. Pharmacies typically print the medication instructions only on the boxes and not directly on the medication tubes.   If your medication is too expensive, please contact our office at [phone] option 4 or send us  a message through MyChart.   We are unable to tell what your co-pay for medications will be in advance as this is different depending on your insurance coverage. However, we may be able to find a substitute medication at lower cost or fill out paperwork to get insurance to cover a needed medication.   If a prior authorization is required to get your medication covered by your insurance company, please allow us  1-2 business days to complete this process.  Drug prices often vary depending on where the prescription is filled and some pharmacies may offer cheaper prices.  The website www.goodrx.com contains coupons for medications through different pharmacies. The prices here do not account for what the cost may be with help from insurance (it may be cheaper with your insurance), but the website can give you the price if you did not use any insurance.  - You can print the associated coupon and take it with your prescription to the pharmacy.  - You may also stop  by our office during regular business hours and pick up a GoodRx coupon card.  - If you need your prescription sent electronically to a different pharmacy, notify our office through [facility] or by phone at [phone] option 4.     Si Usted Necesita Algo Despus de Su Visita  Tambin puede enviarnos un mensaje a travs de Clinical cytogeneticist. Por lo general respondemos a los mensajes de MyChart en el transcurso de 1 a 2 das hbiles.  Para renovar recetas, por favor pida a su farmacia que se ponga en contacto con nuestra oficina. Randi lakes de fax es [place] [phone].  Si tiene un asunto urgente cuando la clnica est cerrada y que no puede esperar  hasta el siguiente da hbil, puede llamar/localizar a su doctor(a) al nmero que aparece a continuacin.   Por favor, tenga en cuenta que aunque hacemos todo lo posible para estar disponibles para asuntos urgentes fuera del horario de Olivia Derak Schurman de [place], no estamos disponibles las 24 horas del da, los 7 [address]  [address] de la [place].   Si tiene un problema urgente y no puede comunicarse con nosotros, puede optar por buscar atencin mdica  en el consultorio de su doctor(a), en una clnica privada, en un centro de atencin urgente o en una sala de emergencias.  Si tiene Engineer, drilling, por favor llame inmediatamente al 911 o vaya a la sala de emergencias.  Nmeros de bper  - Dr. Hester: [phone]  - Dra. Jackquline: [phone]  - Dr. Claudene: [phone]   En caso de inclemencias del tiempo, por favor llame a landry capes principal al [phone] para una actualizacin sobre el [place] de cualquier retraso o cierre.  Consejos para la medicacin en dermatologa: Por favor, guarde las cajas en las que vienen los medicamentos de uso tpico para ayudarle a seguir las instrucciones sobre dnde y cmo usarlos. Las farmacias generalmente imprimen las instrucciones del medicamento slo en las cajas y no directamente en los tubos del [place].   Si su  medicamento es muy caro, por favor, pngase en contacto con landry rieger llamando al [phone] y presione la opcin 4 o envenos un mensaje a travs de Clinical cytogeneticist.   No podemos decirle cul ser su copago por los medicamentos por adelantado ya que esto es diferente dependiendo de la cobertura de su seguro. Sin embargo, es posible que podamos encontrar un medicamento sustituto a Audiological scientist un formulario para que el seguro cubra el medicamento que se considera necesario.   Si se requiere una autorizacin previa para que su compaa de seguros malta su medicamento, por favor permtanos de 1 a 2 das hbiles para completar este proceso.  Los precios de los medicamentos varan con frecuencia dependiendo del Environmental consultant de dnde se surte la receta y alguna farmacias pueden ofrecer precios ms baratos.  El sitio web www.goodrx.com tiene cupones para medicamentos de Health and safety inspector. Los precios aqu no tienen en cuenta lo que podra costar con la ayuda del seguro (puede ser ms barato con su seguro), pero el sitio web puede darle el precio si no utiliz Tourist information centre manager.  - Puede imprimir el cupn correspondiente y llevarlo con su receta a la farmacia.  - Tambin puede pasar por nuestra oficina durante el horario de atencin regular y Education officer, museum una tarjeta de cupones de GoodRx.  - Si necesita que su receta se enve electrnicamente a una farmacia diferente, informe a nuestra oficina a travs de MyChart de [place] o por telfono llamando al [phone] y presione la opcin 4.

## 2023-11-06 ENCOUNTER — Telehealth: Payer: Self-pay

## 2023-11-06 MED ORDER — DOXYCYCLINE HYCLATE 20 MG PO TABS: 20.0000 mg | ORAL_TABLET | Freq: Two times a day (BID) | ORAL | 5 refills | Status: AC

## 2023-11-06 NOTE — Telephone Encounter (Signed)
 Doxycycline  40mg  not covered. Doxycycline  20mg  BID sent in as replacement. aw

## 2023-12-05 ENCOUNTER — Ambulatory Visit

## 2023-12-05 DIAGNOSIS — L821 Other seborrheic keratosis: Secondary | ICD-10-CM

## 2023-12-05 DIAGNOSIS — W908XXA Exposure to other nonionizing radiation, initial encounter: Secondary | ICD-10-CM

## 2023-12-05 DIAGNOSIS — D229 Melanocytic nevi, unspecified: Secondary | ICD-10-CM

## 2023-12-05 DIAGNOSIS — L719 Rosacea, unspecified: Secondary | ICD-10-CM

## 2023-12-05 DIAGNOSIS — D1801 Hemangioma of skin and subcutaneous tissue: Secondary | ICD-10-CM

## 2023-12-05 DIAGNOSIS — L578 Other skin changes due to chronic exposure to nonionizing radiation: Secondary | ICD-10-CM

## 2023-12-05 DIAGNOSIS — Z1283 Encounter for screening for malignant neoplasm of skin: Secondary | ICD-10-CM | POA: Diagnosis not present

## 2023-12-05 DIAGNOSIS — L814 Other melanin hyperpigmentation: Secondary | ICD-10-CM

## 2023-12-05 DIAGNOSIS — Z7189 Other specified counseling: Secondary | ICD-10-CM

## 2023-12-05 NOTE — Progress Notes (Signed)
 Subjective   Sherry Powers is a 38 y.o. female who presents for the following: Total body skin exam for skin cancer screening and mole check. The patient has spots, moles and lesions to be evaluated, some may be new or changing and the patient may have concern these could be cancer.. Patient is established patient   Today patient reports: Rosacea, improved with Doxycycline  20 mg po BID and [facility] compounded mix, but not totally resolved.  Review of Systems:    No other skin or systemic complaints except as noted in HPI or Assessment and Plan.  The following portions of the chart were reviewed this encounter and updated as appropriate: medications, allergies, medical history  Relevant Medical History:  Family history of skin cancer - mother and paternal grandmother   Objective  Well appearing patient in no apparent distress; mood and affect are within normal limits. Examination was performed of the: Full Skin Examination: scalp, head, eyes, ears, nose, lips, neck, chest, axillae, abdomen, back, buttocks, bilateral upper extremities, bilateral lower extremities, hands, feet, fingers, toes, fingernails, and toenails.   Examination notable for: Angioma(s): Scattered red vascular papule(s)  , Lentigo/lentigines: Scattered pigmented macules that are tan to brown in color and are somewhat non-uniform in shape and concentrated in the sun-exposed areas, Nevus/nevi: Scattered well-demarcated, regular, pigmented macule(s) and/or papule(s)  , Seborrheic Keratosis(es): Stuck-on appearing keratotic papule(s) on the trunk, none  irritated with redness, crusting, edema, and/or partial avulsion, Actinic Damage/Elastosis: chronic sun damage: dyspigmentation, telangiectasia, and wrinkling, Rosacea: Mild, diffuse erythema and telangiectasias involving the central face. Some papules and pustules.    Examination limited by: Undergarments and Patient deferred removal      Assessment & Plan   SKIN  CANCER SCREENING PERFORMED TODAY.  BENIGN SKIN FINDINGS  - Lentigines  - Seborrheic keratoses  - Hemangiomas   - Nevus/Multiple Benign Nevi - Reassurance provided regarding the benign appearance of lesions noted on exam today; no treatment is indicated in the absence of symptoms/changes. - Reinforced importance of photoprotective strategies including liberal and frequent sunscreen use of a broad-spectrum SPF 30 or greater, use of protective clothing, and sun avoidance for prevention of cutaneous malignancy and photoaging.  Counseled patient on the importance of regular self-skin monitoring as well as routine clinical skin examinations as scheduled.   ACTINIC DAMAGE - Chronic condition, secondary to cumulative UV/sun exposure - Recommend daily broad spectrum sunscreen SPF 30+ to sun-exposed areas, reapply every 2 hours as needed.  - Staying in the shade or wearing long sleeves, sun glasses (UVA+UVB protection) and wide brim hats (4-inch brim around the entire circumference of the hat) are also recommended for sun protection.  - Call for new or changing lesions.  Rosacea Chronic and persistent condition with duration or expected duration over one year. Condition is stable and improved from prior  - Reviewed etiology and probable recurrent nature of this condition - Discussed potential triggers including caffeine, heat, sun exposure, chocolate, etc. and recommended patient attempt to identify and avoid triggers - Continue Doxycycline  40 mg take po once daily.  - Discussed side effects and precautions with doxycycline  including taking with meal, waiting at least 30 minutes before lying down at night, increased sun sensitivity, and to stop medication if becomes pregnant or breastfeeding  - Continue Azelaic Acid 15%, Metronidazole  1%, Ivermectin 1% Cream from [facility] - Sun avoidance, protective clothing sunscreen discussed - Telangiectasias will likely not fade completely, laser removal may  be considered as a cosmetic procedure  Counseling for BBL / IPL / Laser and Coordination of Care Discussed the treatment option of Broad Band Light (BBL) /Intense Pulsed Light (IPL)/ Laser for skin discoloration, including brown spots and redness.  Typically we recommend at least 1-3 treatment sessions about 5-8 weeks apart for best results.  Cannot have tanned skin when BBL performed, and regular use of sunscreen/photoprotection is advised after the procedure to help maintain results. The patient's condition may also require maintenance treatments in the future.  The fee for BBL / laser treatments is $350 per treatment session for the whole face.  A fee can be quoted for other parts of the body.  Insurance typically does not pay for BBL/laser treatments and therefore the fee is an out-of-pocket cost. Recommend prophylactic valtrex treatment. Once scheduled for procedure, will send Rx in prior to patient's appointment.   Level of service outlined above   Procedures, orders, diagnosis for this visit:  SKIN EXAM FOR MALIGNANT NEOPLASM   LENTIGO   SEBORRHEIC KERATOSIS   CHERRY ANGIOMA   MULTIPLE BENIGN NEVI   ACTINIC SKIN DAMAGE   ROSACEA     Skin exam for malignant neoplasm  Lentigo  Seborrheic keratosis  Cherry angioma  Multiple benign nevi  Actinic skin damage  Rosacea    Return to clinic: Return in about 1 year (around 12/04/2024) for TBSE.  LILLETTE Rosina Mayans, CMA, am acting as scribe for Lauraine JAYSON Kanaris, MD .  Documentation: I have reviewed the above documentation for accuracy and completeness, and I agree with the above.  Lauraine JAYSON Kanaris, MD

## 2023-12-05 NOTE — Patient Instructions (Signed)

## 2024-02-06 ENCOUNTER — Ambulatory Visit

## 2024-12-16 ENCOUNTER — Ambulatory Visit
